# Patient Record
Sex: Female | Born: 2000 | Race: Black or African American | Hispanic: No | Marital: Single | Smoking: Never smoker
Health system: Southern US, Community
[De-identification: ages and names within clinical notes are randomized; demographics above are authoritative.]

## PROBLEM LIST (undated history)

## (undated) DIAGNOSIS — J45909 Unspecified asthma, uncomplicated: Secondary | ICD-10-CM

---
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
## 2021-03-27 ENCOUNTER — Ambulatory Visit (HOSPITAL_COMMUNITY)
Admission: EM | Admit: 2021-03-27 | Discharge: 2021-03-27 | Disposition: A | Discharge status: Home / Self Care | Payer: Medicaid Other | Attending: Family Medicine | Admitting: Family Medicine

## 2021-03-27 ENCOUNTER — Other Ambulatory Visit: Payer: Self-pay

## 2021-03-27 ENCOUNTER — Encounter (HOSPITAL_COMMUNITY): Payer: Self-pay

## 2021-03-27 DIAGNOSIS — N898 Other specified noninflammatory disorders of vagina: Secondary | ICD-10-CM | POA: Diagnosis present

## 2021-03-27 DIAGNOSIS — Z711 Person with feared health complaint in whom no diagnosis is made: Secondary | ICD-10-CM | POA: Insufficient documentation

## 2021-03-27 DIAGNOSIS — R300 Dysuria: Secondary | ICD-10-CM | POA: Diagnosis present

## 2021-03-27 LAB — POCT URINALYSIS DIPSTICK, ED / UC
Glucose, UA: NEGATIVE mg/dL
Ketones, ur: 15 mg/dL — AB
Leukocytes,Ua: NEGATIVE
Nitrite: NEGATIVE
Protein, ur: 30 mg/dL — AB
Specific Gravity, Urine: >=1.03 (ref 1.005–1.030)
Urobilinogen, UA: 1 mg/dL (ref 0.0–1.0)
pH: 5.5 (ref 5.0–8.0)

## 2021-03-27 LAB — POC URINE PREG, ED: Preg Test, Ur: NEGATIVE

## 2021-03-27 MED ORDER — VALACYCLOVIR HCL 1 G PO TABS: 1000.0000 mg | ORAL_TABLET | Freq: Two times a day (BID) | ORAL | 0 refills | Status: AC

## 2021-03-27 NOTE — ED Triage Notes (Signed)
Pt present less urinary frequency with  Burning sensation while urinating. The UTI symptom started a week ago but the burning sensation started yesterday.
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
## 2021-03-28 LAB — CERVICOVAGINAL ANCILLARY ONLY
Bacterial Vaginitis (gardnerella): NEGATIVE
Candida Glabrata: NEGATIVE
Candida Vaginitis: NEGATIVE
Chlamydia: NEGATIVE
Comment: NEGATIVE
Comment: NEGATIVE
Comment: NEGATIVE
Comment: NEGATIVE
Comment: NEGATIVE
Comment: NORMAL
Neisseria Gonorrhea: NEGATIVE
Trichomonas: NEGATIVE

## 2021-03-30 LAB — HSV CULTURE AND TYPING

## 2021-04-28 ENCOUNTER — Ambulatory Visit: Payer: Self-pay | Admitting: Nurse Practitioner

## 2022-02-04 ENCOUNTER — Emergency Department (HOSPITAL_COMMUNITY): Payer: Medicaid Other

## 2022-02-04 ENCOUNTER — Encounter (HOSPITAL_COMMUNITY): Payer: Self-pay | Admitting: Emergency Medicine

## 2022-02-04 ENCOUNTER — Other Ambulatory Visit: Payer: Self-pay

## 2022-02-04 ENCOUNTER — Emergency Department (HOSPITAL_COMMUNITY)
Admission: EM | Admit: 2022-02-04 | Discharge: 2022-02-05 | Disposition: A | Discharge status: Home / Self Care | Payer: Medicaid Other | Attending: Emergency Medicine | Admitting: Emergency Medicine

## 2022-02-04 DIAGNOSIS — Z20822 Contact with and (suspected) exposure to covid-19: Secondary | ICD-10-CM | POA: Insufficient documentation

## 2022-02-04 DIAGNOSIS — R519 Headache, unspecified: Secondary | ICD-10-CM | POA: Diagnosis not present

## 2022-02-04 DIAGNOSIS — R000 Tachycardia, unspecified: Secondary | ICD-10-CM | POA: Diagnosis present

## 2022-02-04 DIAGNOSIS — Z7951 Long term (current) use of inhaled steroids: Secondary | ICD-10-CM | POA: Insufficient documentation

## 2022-02-04 DIAGNOSIS — J4541 Moderate persistent asthma with (acute) exacerbation: Secondary | ICD-10-CM | POA: Insufficient documentation

## 2022-02-04 DIAGNOSIS — Z7952 Long term (current) use of systemic steroids: Secondary | ICD-10-CM | POA: Insufficient documentation

## 2022-02-04 DIAGNOSIS — R42 Dizziness and giddiness: Secondary | ICD-10-CM | POA: Diagnosis not present

## 2022-02-04 LAB — CBC
HCT: 40.7 % (ref 36.0–46.0)
Hemoglobin: 13.8 g/dL (ref 12.0–15.0)
MCH: 29.4 pg (ref 26.0–34.0)
MCHC: 33.9 g/dL (ref 30.0–36.0)
MCV: 86.6 fL (ref 80.0–100.0)
Platelets: 209 10*3/uL (ref 150–400)
RBC: 4.7 MIL/uL (ref 3.87–5.11)
RDW: 12.9 % (ref 11.5–15.5)
WBC: 7.3 10*3/uL (ref 4.0–10.5)
nRBC: 0 % (ref 0.0–0.2)

## 2022-02-04 LAB — BASIC METABOLIC PANEL
Anion gap: 6 (ref 5–15)
BUN: 10 mg/dL (ref 6–20)
CO2: 23 mmol/L (ref 22–32)
Calcium: 9 mg/dL (ref 8.9–10.3)
Chloride: 105 mmol/L (ref 98–111)
Creatinine, Ser: 0.76 mg/dL (ref 0.44–1.00)
GFR, Estimated: >60 mL/min (ref >60)
Glucose, Bld: 163 mg/dL — ABNORMAL HIGH (ref 70–99)
Potassium: 4.3 mmol/L (ref 3.5–5.1)
Sodium: 134 mmol/L — ABNORMAL LOW (ref 135–145)

## 2022-02-04 LAB — D-DIMER, QUANTITATIVE: D-Dimer, Quant: 0.34 ug/mL-FEU (ref 0.00–0.50)

## 2022-02-04 LAB — RESP PANEL BY RT-PCR (FLU A&B, COVID) ARPGX2
Influenza A by PCR: NEGATIVE
Influenza B by PCR: NEGATIVE
SARS Coronavirus 2 by RT PCR: NEGATIVE

## 2022-02-04 LAB — I-STAT BETA HCG BLOOD, ED (MC, WL, AP ONLY): I-stat hCG, quantitative: <5 m[IU]/mL (ref <5)

## 2022-02-04 MED ORDER — SODIUM CHLORIDE 0.9% FLUSH
3.0000 mL | Freq: Once | INTRAVENOUS | Status: AC
  Administered 2022-02-04: 3 mL via INTRAVENOUS

## 2022-02-04 MED ORDER — BENZONATATE 100 MG PO CAPS: 100.0000 mg | ORAL_CAPSULE | Freq: Three times a day (TID) | ORAL | 0 refills | Status: AC

## 2022-02-04 MED ORDER — ALBUTEROL SULFATE HFA 108 (90 BASE) MCG/ACT IN AERS
2.0000 | INHALATION_SPRAY | Freq: Once | RESPIRATORY_TRACT | Status: AC
Start: 2022-02-04 — End: 2022-02-04
  Administered 2022-02-04: 2 via RESPIRATORY_TRACT
  Filled 2022-02-04: qty 6.7

## 2022-02-04 MED ORDER — IPRATROPIUM-ALBUTEROL 0.5-2.5 (3) MG/3ML IN SOLN
3.0000 mL | Freq: Once | RESPIRATORY_TRACT | Status: AC
  Administered 2022-02-04: 3 mL via RESPIRATORY_TRACT
  Filled 2022-02-04: qty 3

## 2022-02-04 MED ORDER — SODIUM CHLORIDE 0.9 % IV BOLUS
1000.0000 mL | Freq: Once | INTRAVENOUS | Status: AC
  Administered 2022-02-04: 1000 mL via INTRAVENOUS

## 2022-02-04 MED ORDER — PREDNISONE 50 MG PO TABS: 50.0000 mg | ORAL_TABLET | Freq: Every day | ORAL | 0 refills | Status: AC

## 2022-02-04 MED ORDER — PREDNISONE 20 MG PO TABS
60.0000 mg | ORAL_TABLET | Freq: Once | ORAL | Status: AC
  Administered 2022-02-04: 60 mg via ORAL
  Filled 2022-02-04: qty 3

## 2022-02-04 MED ORDER — ALBUTEROL SULFATE HFA 108 (90 BASE) MCG/ACT IN AERS: 1.0000 | INHALATION_SPRAY | Freq: Four times a day (QID) | RESPIRATORY_TRACT | 0 refills | Status: AC | PRN

## 2022-02-04 NOTE — Discharge Instructions (Signed)
Your work-up in the emergency department was reassuring.  Likely having an asthma exacerbation. ? ?Make sure to drink plenty fluids and rest ? ?Take medications as prescribed ? ?Return for new or worsening symptoms ?

## 2022-02-04 NOTE — ED Provider Notes (Signed)
?[facility] EMERGENCY DEPARTMENT ?Provider Note ? ? ?CSN: [id number] ?Arrival date & time: 02/04/22  1651 ? ?  ? ?History ? ?Chief Complaint  ?Patient presents with  ? Dizziness  ? ? ?Chloe Kelly is a 21 y.o. female here for feeling well.  [place] about 1 week ago.  Has had nonproductive cough, shortness of breath, wheezing had some chest tightness yesterday.  Feels lightheaded.  Has a mild aching headache.  Had negative COVID test at home.  Took some Robitussin.  Does have history of asthma however she is unsure if this feels similar.  No lower extremity pain, redness, warmth.  No history of PE or DVT.  Tolerating p.o. intake at home.  No recent sick contacts.  No emesis, abd pain, back pain ? ?Asthma as a child, no problems since, no intubations, hospitalization for exacerbation ? ?HPI ? ?  ? ?Home Medications ?Prior to Admission medications   ?Medication Sig Start Date End Date Taking? Authorizing Provider  ?albuterol (VENTOLIN HFA) 108 (90 Base) MCG/ACT inhaler Inhale 1-2 puffs into the lungs every 6 (six) hours as needed for wheezing or shortness of breath. 02/04/22  Yes Jacqulene Huntley A, PA-C  ?benzonatate (TESSALON) 100 MG capsule Take 1 capsule (100 mg total) by mouth every 8 (eight) hours. 02/04/22  Yes Hinley Brimage A, PA-C  ?cetirizine (ZYRTEC) 10 MG tablet Take 10 mg by mouth at bedtime.   Yes [provider]  ?Dextromethorphan-guaiFENesin (ROBITUSSIN COUGH+CHEST CONG DM PO) Take 20 mLs by mouth every 12 (twelve) hours as needed (cough).   Yes [provider]  ?fluticasone (FLONASE) 50 MCG/ACT nasal spray Place 1 spray into both nostrils daily as needed for allergies or rhinitis.   Yes [provider]  ?predniSONE (DELTASONE) 50 MG tablet Take 1 tablet (50 mg total) by mouth daily for 5 days. 02/04/22 02/09/22 Yes Samra Pesch A, PA-C  ?vitamin C (ASCORBIC ACID) 500 MG tablet Take 500 mg by mouth daily.   Yes [provider]  ?chlorhexidine  (PERIDEX) 0.12 % solution Use as directed 15 mLs in the mouth or throat 4 (four) times daily. ?Patient not taking: Reported on 08/28/2018 11/07/12   Melvenia Beam, MD  ?valACYclovir (VALTREX) 1000 MG tablet Take 1 tablet (1,000 mg total) by mouth 2 (two) times daily. ?Patient not taking: Reported on 02/04/2022 03/27/21   Particia Nearing, PA-C  ?   ? ?Allergies    ?Orange juice [orange oil]   ? ?Review of Systems   ?Review of Systems  ?Constitutional:  Positive for fatigue. Negative for chills and fever.  ?HENT: Negative.  Negative for ear pain and sore throat.   ?Eyes:  Negative for pain and visual disturbance.  ?Respiratory:  Positive for cough, chest tightness, shortness of breath and wheezing.   ?Cardiovascular:  Negative for chest pain, palpitations and leg swelling.  ?Gastrointestinal: Negative.  Negative for abdominal pain and vomiting.  ?Genitourinary: Negative.  Negative for dysuria and hematuria.  ?Musculoskeletal: Negative.  Negative for arthralgias and back pain.  ?Skin: Negative.  Negative for color change and rash.  ?Neurological:  Positive for light-headedness and headaches (resolved). Negative for dizziness, seizures, syncope, facial asymmetry, weakness and numbness.  ?All other systems reviewed and are negative. ? ?Physical Exam ?Updated Vital Signs ?BP 125/75   Pulse 93   Temp 98 ?F (36.7 ?C) (Oral)   Resp 19   LMP 01/03/2022   SpO2 100%  ?Physical Exam ?Vitals and nursing note reviewed.  ?Constitutional:   ?  General: She is not in acute distress. ?   Appearance: She is well-developed. She is not ill-appearing, toxic-appearing or diaphoretic.  ?HENT:  ?   Head: Normocephalic and atraumatic.  ?   Nose: Nose normal.  ?   Mouth/Throat:  ?   Mouth: Mucous membranes are moist.  ?Eyes:  ?   Pupils: Pupils are equal, round, and reactive to light.  ?Cardiovascular:  ?   Rate and Rhythm: Tachycardia present.  ?   Pulses: Normal pulses.     ?     Radial pulses are 2+ on the right side and 2+ on the  left side.  ?     Dorsalis pedis pulses are 2+ on the right side and 2+ on the left side.  ?     Posterior tibial pulses are 2+ on the right side and 2+ on the left side.  ?   Heart sounds: Normal heart sounds.  ?   Comments: Tachycardiac ?Pulmonary:  ?   Effort: No respiratory distress.  ?   Breath sounds: Wheezing present.  ?   Comments: Expiratory wheeze, no respiratory distress, speaks in full sentences ?Abdominal:  ?   General: Bowel sounds are normal. There is no distension.  ?   Palpations: Abdomen is soft.  ?   Tenderness: There is no abdominal tenderness. There is no right CVA tenderness, left CVA tenderness or guarding.  ?   Comments: Abdomen soft, nontender  ?Musculoskeletal:     ?   General: No swelling, tenderness, deformity or signs of injury. Normal range of motion.  ?   Cervical back: Normal range of motion.  ?   Right lower leg: No edema.  ?   Left lower leg: No edema.  ?   Comments: Full range of motion, no bony tenderness, compartments soft, no posterior calf tenderness.  No lower extremity edema  ?Skin: ?   General: Skin is warm and dry.  ?   Capillary Refill: Capillary refill takes less than 2 seconds.  ?   Comments: No edema, erythema or warmth  ?Neurological:  ?   General: No focal deficit present.  ?   Mental Status: She is alert and oriented to person, place, and time.  ?   Comments: CN 2-12 grossly intact ?Ambulatory ?Equal strength ?Intact sensation  ?Psychiatric:     ?   Mood and Affect: Mood normal.  ? ? ?ED Results / Procedures / Treatments   ?Labs ?(all labs ordered are listed, but only abnormal results are displayed) ?Labs Reviewed  ?BASIC METABOLIC PANEL - Abnormal; Notable for the following components:  ?    Result Value  ? Sodium 134 (*)   ? Glucose, Bld 163 (*)   ? All other components within normal limits  ?RESP PANEL BY RT-PCR (FLU A&B, COVID) ARPGX2  ?CBC  ?D-DIMER, QUANTITATIVE  ?URINALYSIS, ROUTINE W REFLEX MICROSCOPIC  ?I-STAT BETA HCG BLOOD, ED ([facility], [facility], [facility] ONLY)  ?CBG  MONITORING, ED  ? ? ?EKG ?EKG Interpretation ? ?Date/Time:  Saturday February 04 2022 20:33:04 EDT ?Ventricular Rate:  101 ?PR Interval:  233 ?QRS Duration: 69 ?QT Interval:  322 ?QTC Calculation: 418 ?R Axis:   71 ?Text Interpretation: Sinus tachycardia Confirmed by Cathren LaineSteinl, Kevin ([id number]) on 02/04/2022 10:11:46 PM ? ?Radiology ?DG Chest 2 View ? ?Result Date: 02/04/2022 ?CLINICAL DATA:  Shortness of breath.  Nonproductive cough. EXAM: CHEST - 2 VIEW COMPARISON:  None. FINDINGS: The heart size and mediastinal contours are within normal limits. Both lungs  are clear. The visualized skeletal structures are unremarkable. IMPRESSION: No active cardiopulmonary disease. Electronically Signed   By: Gerome Sam III M.D.   On: 02/04/2022 19:07   ? ?Procedures ?Procedures  ? ? ?Medications Ordered in ED ?Medications  ?sodium chloride flush (NS) 0.9 % injection 3 mL (3 mLs Intravenous Given 02/04/22 1957)  ?predniSONE (DELTASONE) tablet 60 mg (60 mg Oral Given 02/04/22 1944)  ?albuterol (VENTOLIN HFA) 108 (90 Base) MCG/ACT inhaler 2 puff (2 puffs Inhalation Given 02/04/22 1945)  ?sodium chloride 0.9 % bolus 1,000 mL (0 mLs Intravenous Stopped 02/04/22 2102)  ?ipratropium-albuterol (DUONEB) 0.5-2.5 (3) MG/3ML nebulizer solution 3 mL (3 mLs Nebulization Given 02/04/22 2142)  ? ?ED Course/ Medical Decision Making/ A&P ?  ? ?21 year old here for evaluation of SOB, lightheadedness. Afebrile non septic non ill appearing. Recent plane travel earlier this week. No hx of PE, DVT. Has some wheeze on exam. No clinical of VTE however cannot PERC, will add on ddimer given tachycardia and recent travel hx. ? ?Labs and imaging personally viewed and interpreted: ? ?CBC without leukocytosis ?BMP sodium 134, glucose 163 ?COVID/Flu neg ?Preg neg ?DG chest without infiltrates, cardiomegaly, pul edema, pneumothorax ?EKG sinus tachycardia ?Ddimer 0.34 ? ?Patient reassessed.  Has persistent wheeze.  Will give DuoNeb. ? ?Patient reassessed.  Wheeze improved.   Suspect viral URI causing asthma exacerbation.  Low suspicion for acute bacterial PNA, PE, ACS, pneumothorax, pul edema causes sx. ? ?The patient has been appropriately medically screened and/or stabilized in the ED. I hav

## 2022-02-04 NOTE — ED Triage Notes (Signed)
Pt reports non-productive cough, SOB, wheezing, feeling lightheaded, and intermittent chest tightness since yesterday.  Negative home COVID test today.  Took Robitussin this afternoon with some relief. ?

## 2023-08-11 IMAGING — DX DG CHEST 2V
2 series · 2 of 2 positions shown · non-contrast
Comparison: None.

CLINICAL DATA: Shortness of breath.  Nonproductive cough.

EXAM:
CHEST - 2 VIEW

[chest pa]
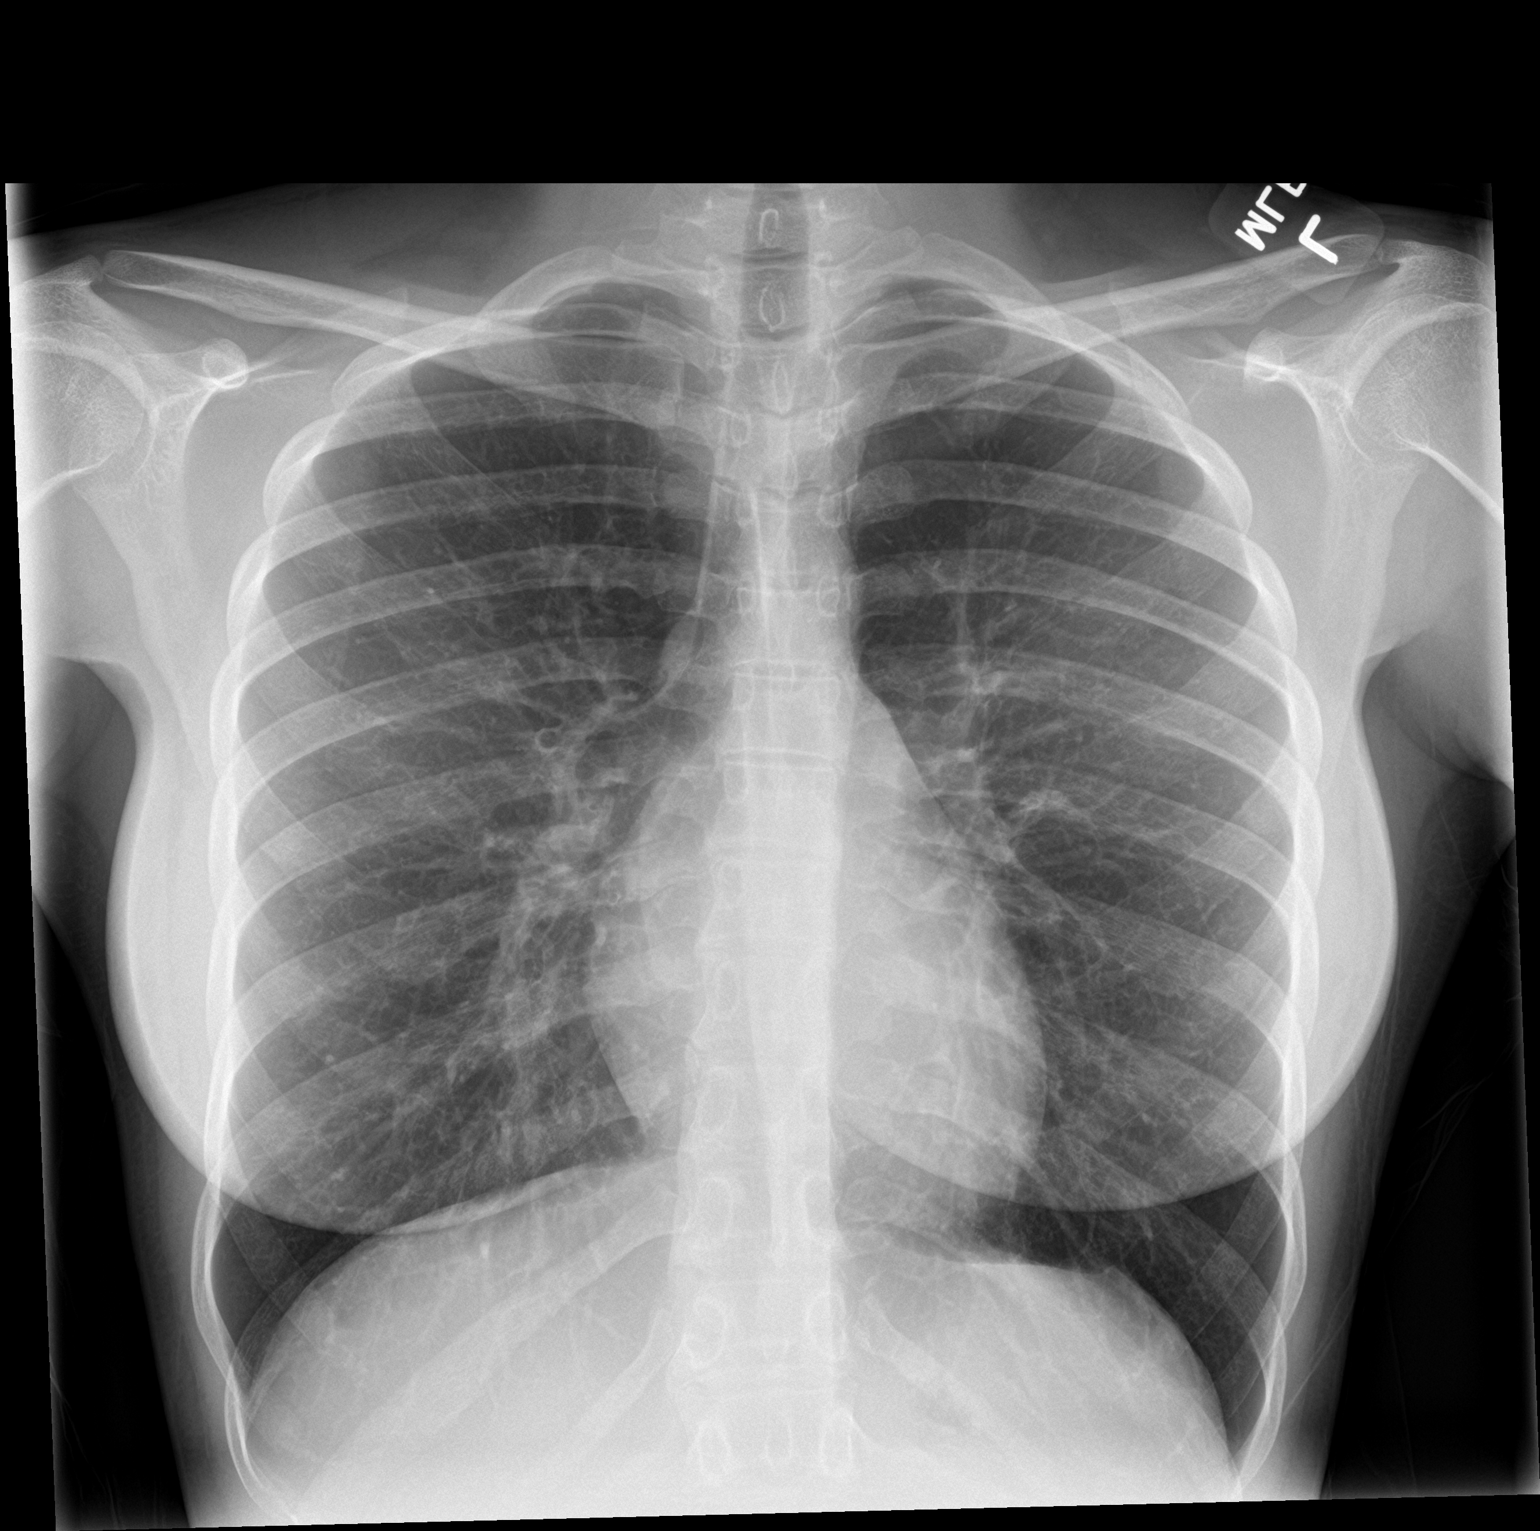

[chest lat]
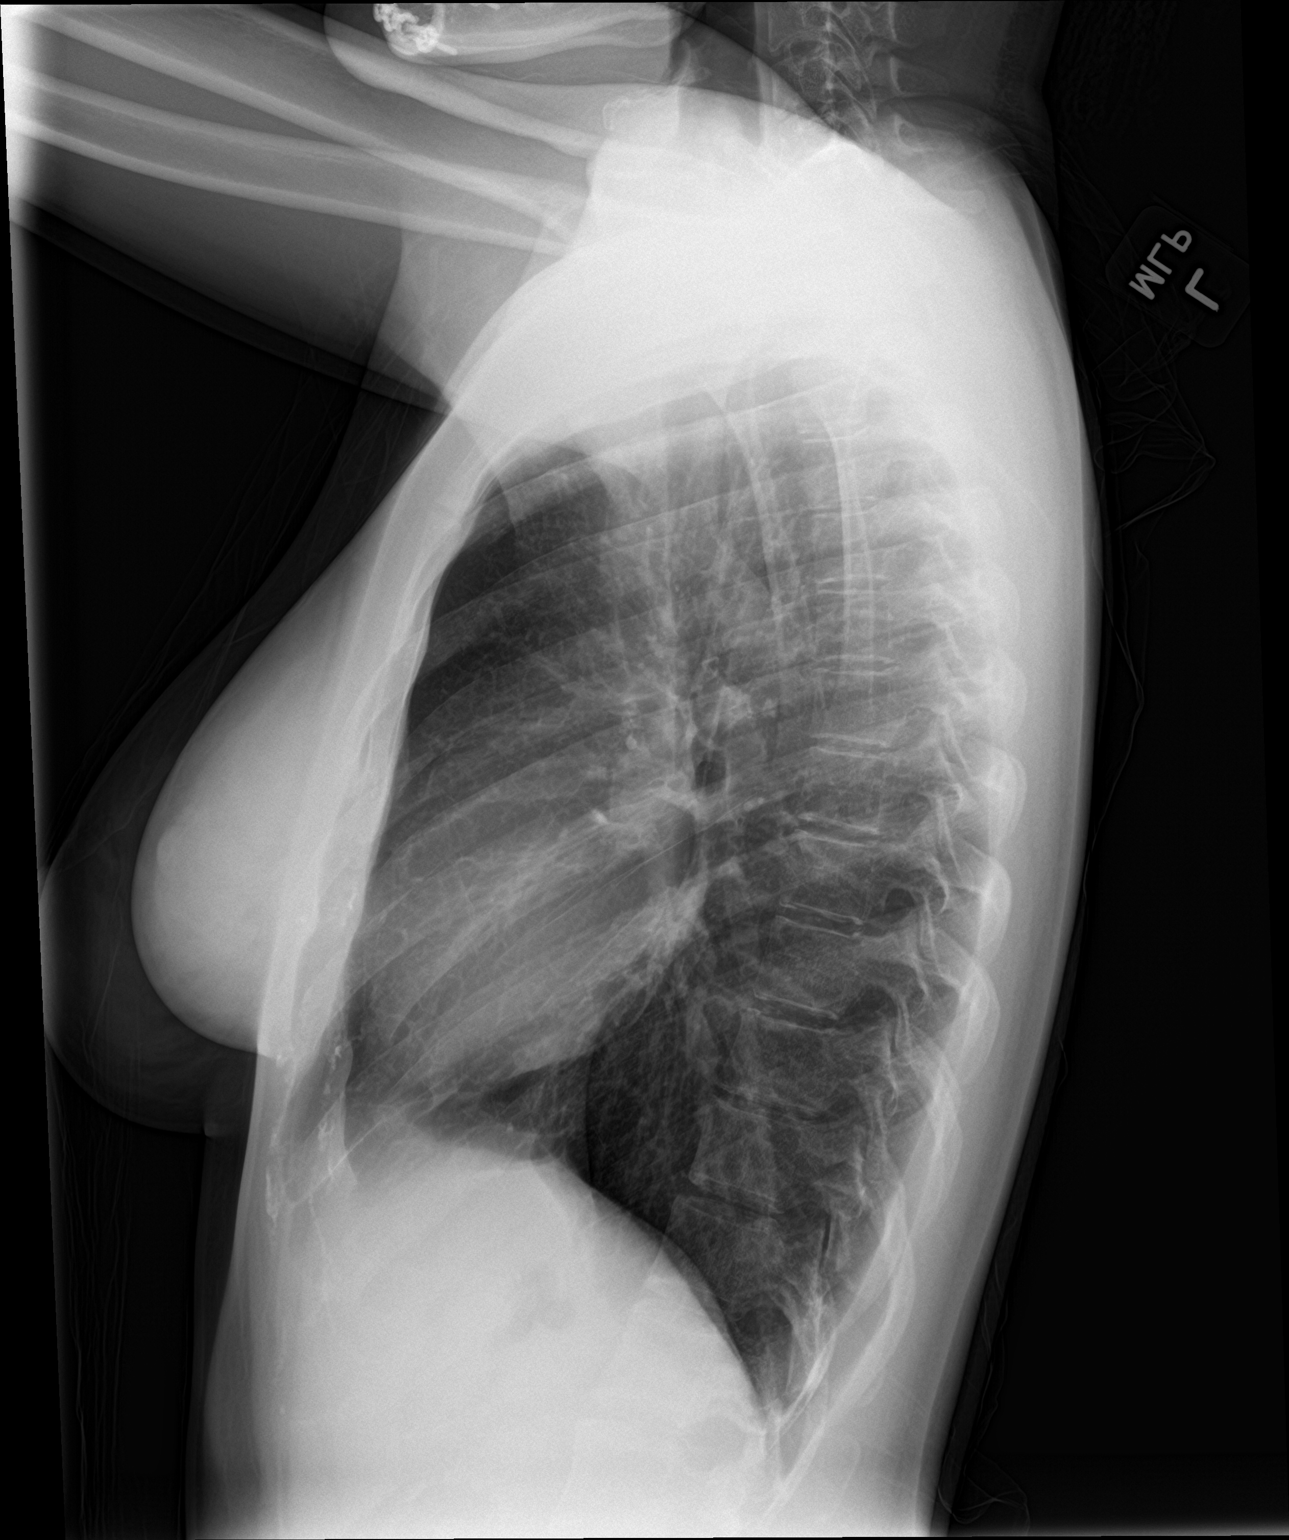

[2 of 2 positions shown; findings below may reference images not displayed]

FINDINGS: The heart size and mediastinal contours are within normal limits.
Both lungs are clear. The visualized skeletal structures are
unremarkable.
IMPRESSION: No active cardiopulmonary disease.

## 2024-10-17 DIAGNOSIS — Z419 Encounter for procedure for purposes other than remedying health state, unspecified: Secondary | ICD-10-CM | POA: Diagnosis not present
